# Patient Record
Sex: Male | Born: 1990 | Race: Black or African American | Hispanic: No | Marital: Single | Smoking: Never smoker
Health system: Southern US, Community
[De-identification: ages and names within clinical notes are randomized; demographics above are authoritative.]

## PROBLEM LIST (undated history)

## (undated) DIAGNOSIS — Z789 Other specified health status: Secondary | ICD-10-CM

## (undated) HISTORY — PX: NO PAST SURGERIES: SHX2092

## (undated) HISTORY — DX: Other specified health status: Z78.9

---
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
## 2014-10-03 ENCOUNTER — Ambulatory Visit (INDEPENDENT_AMBULATORY_CARE_PROVIDER_SITE_OTHER): Payer: BLUE CROSS/BLUE SHIELD

## 2014-10-03 ENCOUNTER — Ambulatory Visit (INDEPENDENT_AMBULATORY_CARE_PROVIDER_SITE_OTHER): Payer: BLUE CROSS/BLUE SHIELD | Admitting: Family Medicine

## 2014-10-03 VITALS — BP 145/80 | HR 89 | Temp 98.2°F | Resp 18 | Ht 73.0 in | Wt 384.0 lb

## 2014-10-03 DIAGNOSIS — R12 Heartburn: Secondary | ICD-10-CM

## 2014-10-03 DIAGNOSIS — K122 Cellulitis and abscess of mouth: Secondary | ICD-10-CM | POA: Diagnosis not present

## 2014-10-03 DIAGNOSIS — R400 Somnolence: Secondary | ICD-10-CM

## 2014-10-03 DIAGNOSIS — E669 Obesity, unspecified: Secondary | ICD-10-CM | POA: Diagnosis not present

## 2014-10-03 DIAGNOSIS — Z79899 Other long term (current) drug therapy: Secondary | ICD-10-CM | POA: Diagnosis not present

## 2014-10-03 DIAGNOSIS — G4710 Hypersomnia, unspecified: Secondary | ICD-10-CM

## 2014-10-03 DIAGNOSIS — R0683 Snoring: Secondary | ICD-10-CM | POA: Diagnosis not present

## 2014-10-03 LAB — POCT RAPID STREP A (OFFICE): Rapid Strep A Screen: NEGATIVE

## 2014-10-03 LAB — GLUCOSE, POCT (MANUAL RESULT ENTRY): POC GLUCOSE: 113 mg/dL — AB (ref 70–99)

## 2014-10-03 MED ORDER — PREDNISONE 20 MG PO TABS: 40.0000 mg | ORAL_TABLET | Freq: Every day | ORAL | Status: DC

## 2014-10-03 MED ORDER — AZITHROMYCIN 250 MG PO TABS: ORAL_TABLET | ORAL | Status: DC

## 2014-10-03 MED ORDER — OMEPRAZOLE 20 MG PO CPDR: 20.0000 mg | DELAYED_RELEASE_CAPSULE | Freq: Every day | ORAL | Status: DC

## 2014-10-03 NOTE — Patient Instructions (Addendum)
Your x-ray appears normal, your strep test was also normal. We can start azithromycin for right now to cover for other possible infections that will cause your uvula to swell, can also take Allegra over-the-counter once per day, and I also prescribed some prednisone in case this is allergic in nature.  I also sent in some omeprazole but can treat heartburn. Avoid spicy foods, and fluids are most important for the next few days until the swelling improves. If the swelling increases in size again to where you feel it is difficult to swallow or your choking, be seen in the emergency room to make sure there is no other treatment needed.  Return to the clinic or go to the nearest emergency room if any of your symptoms worsen or new symptoms occur.  I also referred you for the sleep study as we discussed, and follow back up with me to discuss weight and ways to address this further

## 2014-10-03 NOTE — Progress Notes (Addendum)
Subjective:    Patient ID: Bryan Garcia, male    DOB: May 27, 1990, 24 y.o.   MRN: [id number] This chart was scribed for Meredith Staggers, MD by Jolene Provost, Medical Scribe. This patient was seen in Room 8 and the patient's care was started a 3:50 PM.  Chief Complaint  Patient presents with  . Sore Throat    edema since sunday   . Shortness of Breath    HPI HPI Comments: Bryan Garcia is a 24 y.o. male who presents to [facility] complaining of sore and swollen throat and uvula for the last five days with associated difficulty breathing and choking. He states his sx are better today then they were two days ago. He endorses associated HA. At the onset of his sx he woke up suddenly having difficulty breathing due to mouth dryness and throat swelling. He denies fever, but states he hasn't checked his temperature. He denies sick contacts. He admits to snoring at night. He endorses daytime somnolence. He states he has a past hx of heartburn, but does not recall having any sx this week. He denies inhaling of steam or chemicals. Denies recent mariajuana use. Denies recent oral procedures.   He works as a Designer, television/film set for [employer].   There are no active problems to display for this patient.  History reviewed. No pertinent past medical history. History reviewed. No pertinent past surgical history. No Known Allergies Prior to Admission medications   Not on File   Social History   Social History  . Marital Status: Single    Spouse Name: N/A  . Number of Children: N/A  . Years of Education: N/A   Occupational History  . Not on file.   Social History Main Topics  . Smoking status: Never Smoker   . Smokeless tobacco: Not on file  . Alcohol Use: 0.6 oz/week    1 Standard drinks or equivalent per week  . Drug Use: No  . Sexual Activity: Not on file   Other Topics Concern  . Not on file   Social History Narrative  . No narrative on file    Review of Systems  Constitutional: Negative for  fever and chills.  HENT: Positive for sore throat. Negative for mouth sores and trouble swallowing.   Respiratory: Positive for choking and shortness of breath.   Psychiatric/Behavioral: Positive for sleep disturbance.       Objective:   Physical Exam  Constitutional: He is oriented to person, place, and time. He appears well-developed and well-nourished. No distress.  Clearing secretions normally, no distress.   HENT:  Head: Normocephalic and atraumatic.  Right Ear: Tympanic membrane, external ear and ear canal normal.  Left Ear: Tympanic membrane, external ear and ear canal normal.  Nose: No rhinorrhea.  Mouth/Throat: Oropharynx is clear and moist and mucous membranes are normal. No oropharyngeal exudate or posterior oropharyngeal erythema.  Slightly edematous, edematous, touches the back of the tongue but lifts with saying aaah. No vesicles in the mouth. No peritonsillar erythema or abscess.    Eyes: Conjunctivae are normal. Pupils are equal, round, and reactive to light.  Neck: Neck supple.  No stridor.  Cardiovascular: Normal rate, regular rhythm, normal heart sounds and intact distal pulses.   No murmur heard. Pulmonary/Chest: Effort normal and breath sounds normal. No respiratory distress. He has no wheezes. He has no rhonchi. He has no rales.  Abdominal: Soft. There is no tenderness.  Musculoskeletal: Normal range of motion.  Lymphadenopathy:    He has  no cervical adenopathy.  Neurological: He is alert and oriented to person, place, and time. Coordination normal.  Skin: Skin is warm and dry. No rash noted. He is not diaphoretic.  Small skin tag on the upper outer aspect of the uvula. No tonsillar exudates, or edema.  Psychiatric: He has a normal mood and affect. His behavior is normal.  Nursing note and vitals reviewed.  Filed Vitals:   10/03/14 1504  BP: 145/80  Pulse: 89  Temp: 98.2 F (36.8 C)  TempSrc: Oral  Resp: 18  Height:  (1.854 m)  Weight: 384 lb  (174.181 kg)  SpO2: 98%    Results for orders placed or performed in visit on 10/03/14  POCT rapid strep A  Result Value Ref Range   Rapid Strep A Screen Negative Negative  POCT glucose (manual entry)  Result Value Ref Range   POC Glucose 113 (A) 70 - 99 mg/dl    [facility] reading (PRIMARY) by  Dr. Neva Seat: Soft tissue neck. No apparent significant soft tissue swelling including epiglottis.      Assessment & Plan:   Bryan Garcia is a 24 y.o. male Uvulitis - Plan: POCT rapid strep A, DG Neck Soft Tissue, azithromycin (ZITHROMAX) 250 MG tablet, predniSONE (DELTASONE) 20 MG tablet  -Allergic versus less likely infectious source. Strep testing was negative, no exudates seen. Soft tissue of neck does not indicate any apparent epiglottic involvement. He is clearing secretions without difficulty and his swelling and symptoms have improved since yesterday. No apparent airway compromise  -Z-Pak to cover possible infectious source, Allegra over-the-counter once per day, and start prednisone 40 mg daily for 5 days.  -If symptoms persist, refer to ENT  -If any worsening of swelling or trouble clearing secretions, advised emergency department evaluation or 911.  Snoring, Daytime somnolence - Plan: Ambulatory referral to Sleep Studies  -Possible obstructive sleep apnea. Will refer for sleep studies to evaluate further.  Heartburn - Plan: omeprazole (PRILOSEC) 20 MG capsule  -Trigger avoidance discussed, omeprazole 20 mg daily. Discuss further if persistent symptoms, or chronic need of omeprazole.  High risk medication use - Plan: POCT glucose (manual entry) okay as nonfasting (for prednisone use)  Obesity  -Planning to discuss further at next visit. glucose drawn as above for prednisone use.  Meds ordered this encounter  Medications  . azithromycin (ZITHROMAX) 250 MG tablet    Sig: Take 2 pills by mouth on day 1, then 1 pill by mouth per day on days 2 through 5.    Dispense:  6 tablet     Refill:  0  . omeprazole (PRILOSEC) 20 MG capsule    Sig: Take 1 capsule (20 mg total) by mouth daily.    Dispense:  30 capsule    Refill:  1  . predniSONE (DELTASONE) 20 MG tablet    Sig: Take 2 tablets (40 mg total) by mouth daily with breakfast.    Dispense:  10 tablet    Refill:  0   Patient Instructions  Your x-ray appears normal, your strep test was also normal. We can start azithromycin for right now to cover for other possible infections that will cause your uvula to swell, can also take Allegra over-the-counter once per day, and I also prescribed some prednisone in case this is allergic in nature.  I also sent in some omeprazole but can treat heartburn. Avoid spicy foods, and fluids are most important for the next few days until the swelling improves. If the swelling increases  in size again to where you feel it is difficult to swallow or your choking, be seen in the emergency room to make sure there is no other treatment needed.  Return to the clinic or go to the nearest emergency room if any of your symptoms worsen or new symptoms occur.  I also referred you for the sleep study as we discussed, and follow back up with me to discuss weight and ways to address this further        By signing my name below, I, Javier Docker, attest that this documentation has been prepared under the direction and in the presence of Meredith Staggers, MD. Electronically Signed: Javier Docker, ER Scribe. 10/03/2014. 4:14 PM.   I personally performed the services described in this documentation, which was scribed in my presence. The recorded information has been reviewed and considered, and addended by me as needed.  \

## 2015-09-01 ENCOUNTER — Emergency Department (HOSPITAL_COMMUNITY)
Admission: EM | Admit: 2015-09-01 | Discharge: 2015-09-01 | Disposition: A | Discharge status: Home / Self Care | Payer: BLUE CROSS/BLUE SHIELD | Attending: Emergency Medicine | Admitting: Emergency Medicine

## 2015-09-01 ENCOUNTER — Encounter (HOSPITAL_COMMUNITY): Payer: Self-pay | Admitting: Emergency Medicine

## 2015-09-01 DIAGNOSIS — Y939 Activity, unspecified: Secondary | ICD-10-CM | POA: Diagnosis not present

## 2015-09-01 DIAGNOSIS — Y999 Unspecified external cause status: Secondary | ICD-10-CM | POA: Insufficient documentation

## 2015-09-01 DIAGNOSIS — R51 Headache: Secondary | ICD-10-CM | POA: Insufficient documentation

## 2015-09-01 DIAGNOSIS — Y92410 Unspecified street and highway as the place of occurrence of the external cause: Secondary | ICD-10-CM | POA: Insufficient documentation

## 2015-09-01 MED ORDER — NAPROXEN 500 MG PO TABS: 500.0000 mg | ORAL_TABLET | Freq: Two times a day (BID) | ORAL | 0 refills | Status: DC

## 2015-09-01 MED ORDER — METHOCARBAMOL 500 MG PO TABS: 500.0000 mg | ORAL_TABLET | Freq: Two times a day (BID) | ORAL | 0 refills | Status: DC
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
## 2015-09-01 NOTE — ED Notes (Signed)
Updated Dr. Wilkie AyeHorton on pt.'s right temporal headache with mild swelling , no order given .
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
## 2018-06-06 ENCOUNTER — Emergency Department (HOSPITAL_COMMUNITY): Payer: BLUE CROSS/BLUE SHIELD

## 2018-06-06 ENCOUNTER — Other Ambulatory Visit: Payer: Self-pay

## 2018-06-06 ENCOUNTER — Encounter (HOSPITAL_COMMUNITY): Payer: Self-pay | Admitting: Emergency Medicine

## 2018-06-06 ENCOUNTER — Emergency Department (HOSPITAL_COMMUNITY)
Admission: EM | Admit: 2018-06-06 | Discharge: 2018-06-06 | Disposition: A | Discharge status: Home / Self Care | Payer: BLUE CROSS/BLUE SHIELD | Attending: Emergency Medicine | Admitting: Emergency Medicine

## 2018-06-06 DIAGNOSIS — T148XXA Other injury of unspecified body region, initial encounter: Secondary | ICD-10-CM | POA: Diagnosis not present

## 2018-06-06 DIAGNOSIS — Y9389 Activity, other specified: Secondary | ICD-10-CM | POA: Insufficient documentation

## 2018-06-06 DIAGNOSIS — S20212A Contusion of left front wall of thorax, initial encounter: Secondary | ICD-10-CM | POA: Diagnosis not present

## 2018-06-06 DIAGNOSIS — Y92410 Unspecified street and highway as the place of occurrence of the external cause: Secondary | ICD-10-CM | POA: Insufficient documentation

## 2018-06-06 DIAGNOSIS — S0003XA Contusion of scalp, initial encounter: Secondary | ICD-10-CM | POA: Diagnosis not present

## 2018-06-06 DIAGNOSIS — S51812A Laceration without foreign body of left forearm, initial encounter: Secondary | ICD-10-CM | POA: Insufficient documentation

## 2018-06-06 DIAGNOSIS — Z23 Encounter for immunization: Secondary | ICD-10-CM | POA: Insufficient documentation

## 2018-06-06 DIAGNOSIS — R51 Headache: Secondary | ICD-10-CM | POA: Diagnosis not present

## 2018-06-06 DIAGNOSIS — S59912A Unspecified injury of left forearm, initial encounter: Secondary | ICD-10-CM | POA: Diagnosis present

## 2018-06-06 DIAGNOSIS — S301XXA Contusion of abdominal wall, initial encounter: Secondary | ICD-10-CM | POA: Insufficient documentation

## 2018-06-06 DIAGNOSIS — Y998 Other external cause status: Secondary | ICD-10-CM | POA: Diagnosis not present

## 2018-06-06 LAB — COMPREHENSIVE METABOLIC PANEL
ALT: 47 U/L — ABNORMAL HIGH (ref 0–44)
AST: 41 U/L (ref 15–41)
Albumin: 3.7 g/dL (ref 3.5–5.0)
Alkaline Phosphatase: 85 U/L (ref 38–126)
Anion gap: 8 (ref 5–15)
BUN: 13 mg/dL (ref 6–20)
CO2: 27 mmol/L (ref 22–32)
Calcium: 9.3 mg/dL (ref 8.9–10.3)
Chloride: 107 mmol/L (ref 98–111)
Creatinine, Ser: 1.2 mg/dL (ref 0.61–1.24)
GFR calc Af Amer: >60 mL/min (ref >60)
GFR calc non Af Amer: >60 mL/min (ref >60)
Glucose, Bld: 157 mg/dL — ABNORMAL HIGH (ref 70–99)
Potassium: 4.8 mmol/L (ref 3.5–5.1)
Sodium: 142 mmol/L (ref 135–145)
Total Bilirubin: 0.8 mg/dL (ref 0.3–1.2)
Total Protein: 6.7 g/dL (ref 6.5–8.1)

## 2018-06-06 LAB — I-STAT CHEM 8, ED
BUN: 14 mg/dL (ref 6–20)
Calcium, Ion: 1.17 mmol/L (ref 1.15–1.40)
Chloride: 109 mmol/L (ref 98–111)
Creatinine, Ser: 1.1 mg/dL (ref 0.61–1.24)
Glucose, Bld: 158 mg/dL — ABNORMAL HIGH (ref 70–99)
HCT: 42 % (ref 39.0–52.0)
Hemoglobin: 14.3 g/dL (ref 13.0–17.0)
Potassium: 4.1 mmol/L (ref 3.5–5.1)
Sodium: 144 mmol/L (ref 135–145)
TCO2: 25 mmol/L (ref 22–32)

## 2018-06-06 LAB — CBC
HCT: 41.5 % (ref 39.0–52.0)
Hemoglobin: 12.8 g/dL — ABNORMAL LOW (ref 13.0–17.0)
MCH: 26.8 pg (ref 26.0–34.0)
MCHC: 30.8 g/dL (ref 30.0–36.0)
MCV: 87 fL (ref 80.0–100.0)
Platelets: 302 10*3/uL (ref 150–400)
RBC: 4.77 MIL/uL (ref 4.22–5.81)
RDW: 14.3 % (ref 11.5–15.5)
WBC: 11.2 10*3/uL — ABNORMAL HIGH (ref 4.0–10.5)
nRBC: 0 % (ref 0.0–0.2)

## 2018-06-06 LAB — PROTIME-INR
INR: 1 (ref 0.8–1.2)
Prothrombin Time: 13.4 seconds (ref 11.4–15.2)

## 2018-06-06 LAB — SAMPLE TO BLOOD BANK

## 2018-06-06 MED ORDER — LIDOCAINE-EPINEPHRINE (PF) 2 %-1:200000 IJ SOLN
INTRAMUSCULAR | Status: AC
  Administered 2018-06-06: 06:00:00
  Filled 2018-06-06: qty 20

## 2018-06-06 MED ORDER — SODIUM CHLORIDE 0.9 % IV BOLUS
1000.0000 mL | Freq: Once | INTRAVENOUS | Status: AC
  Administered 2018-06-06: 1000 mL via INTRAVENOUS

## 2018-06-06 MED ORDER — TETANUS-DIPHTH-ACELL PERTUSSIS 5-2.5-18.5 LF-MCG/0.5 IM SUSP
0.5000 mL | Freq: Once | INTRAMUSCULAR | Status: AC
  Administered 2018-06-06: 0.5 mL via INTRAMUSCULAR
  Filled 2018-06-06: qty 0.5

## 2018-06-06 MED ORDER — METHOCARBAMOL 500 MG PO TABS: 500.0000 mg | ORAL_TABLET | Freq: Three times a day (TID) | ORAL | 0 refills | Status: DC | PRN

## 2018-06-06 MED ORDER — IOHEXOL 300 MG/ML  SOLN
100.0000 mL | Freq: Once | INTRAMUSCULAR | Status: AC | PRN
  Administered 2018-06-06: 100 mL via INTRAVENOUS

## 2018-06-06 MED ORDER — NAPROXEN 500 MG PO TABS: 500.0000 mg | ORAL_TABLET | Freq: Two times a day (BID) | ORAL | 0 refills | Status: AC

## 2018-06-06 MED ORDER — CEPHALEXIN 500 MG PO CAPS: 500.0000 mg | ORAL_CAPSULE | Freq: Three times a day (TID) | ORAL | 0 refills | Status: AC

## 2018-06-06 NOTE — ED Notes (Signed)
Patient provided with ginger ale - sitting up and tolerating well at this time. MD at bedside speaking with patient and patient's mother.

## 2018-06-06 NOTE — ED Notes (Signed)
ED Provider at bedside suturing patient's leg laceration.

## 2018-06-06 NOTE — ED Provider Notes (Signed)
  ..  Laceration Repair Date/Time: 06/06/2018 10:15 AM Performed by: Anselm Pancoast, PA-C Authorized by: Anselm Pancoast, PA-C   Consent:    Consent obtained:  Verbal   Consent given by:  Patient   Risks discussed:  Need for additional repair, infection, poor cosmetic result, poor wound healing, pain and retained foreign body Anesthesia (see MAR for exact dosages):    Anesthesia method:  Local infiltration   Local anesthetic:  Lidocaine 2% WITH epi Laceration details:    Location:  Leg   Leg location:  L lower leg   Length (cm):  16 Repair type:    Repair type:  Complex Pre-procedure details:    Preparation:  Patient was prepped and draped in usual sterile fashion Exploration:    Hemostasis achieved with:  Epinephrine   Wound exploration: entire depth of wound probed and visualized   Treatment:    Area cleansed with:  Betadine and saline   Amount of cleaning:  Extensive   Irrigation solution:  Sterile saline   Irrigation method:  Syringe   Debridement:  Minimal Subcutaneous repair:    Suture size:  4-0   Suture material:  Chromic gut   Suture technique:  Figure eight   Number of sutures:  4 Skin repair:    Repair method:  Sutures   Suture size:  3-0   Suture material:  Prolene   Suture technique:  Simple interrupted   Number of sutures:  17 Approximation:    Approximation:  Close Post-procedure details:    Dressing:  Non-adherent dressing and sterile dressing   Patient tolerance of procedure:  Tolerated well, no immediate complications     Concepcion Living 06/06/18 1059    Shaune Pollack, MD 06/06/18 2023

## 2018-06-06 NOTE — ED Notes (Signed)
Pt ambulated in hallway to the bathroom independently.

## 2018-06-06 NOTE — ED Provider Notes (Signed)
LACERATION REPAIR Performed by: Antony Madura Authorized by: Drema Pry, MD Consent: Verbal consent obtained. Risks and benefits: risks, benefits and alternatives were discussed Consent given by: patient Patient identity confirmed: provided demographic data Prepped and Draped in normal sterile fashion Wound explored  Laceration Location: L forearm  Laceration Length: 6cm  No Foreign Bodies seen or palpated  Anesthesia: local infiltration  Local anesthetic: lidocaine 2% with epinephrine  Anesthetic total: 6 ml  Irrigation method: syringe Amount of cleaning: standard  Skin closure: 4-0 ethilon  Number of sutures: 5  Technique: simple interrupted (1), horizontal mattress (4)  Patient tolerance: Patient tolerated the procedure well with no immediate complications.    Antony Madura, PA-C 06/06/18 9179    Nira Conn, MD 06/06/18 [phone]
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
## 2018-06-06 NOTE — ED Notes (Signed)
Patient verbalized understanding of discharge instructions and denies any further needs or questions at this time. VS stable. Patient ambulatory with steady gait. Escorted patient to vehicle at ED entrance in wheelchair.

## 2018-06-06 NOTE — Discharge Instructions (Addendum)
For your wounds, you can wash them with soap, do not rub with soap or rub to try.  Let them air dry after blotting.  Apply antibiotic ointment to the abrasions, as well as the lacerations.  Have prescribed antibiotic to help prevent infection.  Do not swim or submerge the limbs underwater, until sutures are removed in 10 days.  You can return to the ER or follow-up with your regular doctor for this.  If you develop worsening abdominal pain, shortness of breath, chest pain, return to the ER.  There is a small but real chance that initial traumatic injuries are missed, and it is important to seek medical evaluation.
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
## 2019-04-20 ENCOUNTER — Ambulatory Visit: Payer: BLUE CROSS/BLUE SHIELD | Attending: Internal Medicine

## 2019-04-20 DIAGNOSIS — Z23 Encounter for immunization: Secondary | ICD-10-CM

## 2019-04-20 NOTE — Progress Notes (Signed)
   Covid-19 Vaccination Clinic  Name:  Bryan Garcia    MRN: [id number] DOB: 1990-07-11  04/20/2019  [name] was observed post Covid-19 immunization for 15 minutes without incident. He was provided with Vaccine Information Sheet and instruction to access the V-Safe system.   [name] was instructed to call 911 with any severe reactions post vaccine: Marland Kitchen Difficulty breathing  . Swelling of face and throat  . A fast heartbeat  . A bad rash all over body  . Dizziness and weakness   Immunizations Administered    Name Date Dose VIS Date Route   Moderna COVID-19 Vaccine 04/20/2019 12:46 PM 0.5 mL 12/11/2018 Intramuscular   Manufacturer: Gala Murdoch   Lot: [id number]   NDC: 89842-103-12

## 2019-05-18 ENCOUNTER — Ambulatory Visit: Payer: BLUE CROSS/BLUE SHIELD | Attending: Internal Medicine

## 2019-05-18 DIAGNOSIS — Z23 Encounter for immunization: Secondary | ICD-10-CM

## 2019-05-18 NOTE — Progress Notes (Signed)
   Covid-19 Vaccination Clinic  Name:  Bryan Garcia    MRN: [id number] DOB: [birth year]  05/18/2019  Bryan Garcia was observed post Covid-19 immunization for 15 minutes without incident. He was provided with Vaccine Information Sheet and instruction to access the V-Safe system.   Bryan Garcia was instructed to call 911 with any severe reactions post vaccine: Marland Kitchen Difficulty breathing  . Swelling of face and throat  . A fast heartbeat  . A bad rash all over body  . Dizziness and weakness   Immunizations Administered    Name Date Dose VIS Date Route   Moderna COVID-19 Vaccine 05/18/2019 12:01 PM 0.5 mL 12/2018 Intramuscular   Manufacturer: Moderna   Lot: [id number]   NDC: 77373-668-15
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
## 2021-01-26 ENCOUNTER — Ambulatory Visit (INDEPENDENT_AMBULATORY_CARE_PROVIDER_SITE_OTHER): Payer: Managed Care, Other (non HMO) | Admitting: Family Medicine

## 2021-01-26 ENCOUNTER — Encounter: Payer: Self-pay | Admitting: Family Medicine

## 2021-01-26 VITALS — BP 111/62 | HR 88 | Temp 98.4°F | Ht 73.0 in | Wt >= 6400 oz

## 2021-01-26 DIAGNOSIS — Z0000 Encounter for general adult medical examination without abnormal findings: Secondary | ICD-10-CM

## 2021-01-26 DIAGNOSIS — Z114 Encounter for screening for human immunodeficiency virus [HIV]: Secondary | ICD-10-CM | POA: Diagnosis not present

## 2021-01-26 DIAGNOSIS — M79671 Pain in right foot: Secondary | ICD-10-CM | POA: Diagnosis not present

## 2021-01-26 DIAGNOSIS — M79672 Pain in left foot: Secondary | ICD-10-CM

## 2021-01-26 DIAGNOSIS — R0681 Apnea, not elsewhere classified: Secondary | ICD-10-CM | POA: Diagnosis not present

## 2021-01-26 DIAGNOSIS — Z1159 Encounter for screening for other viral diseases: Secondary | ICD-10-CM | POA: Diagnosis not present

## 2021-01-26 NOTE — Progress Notes (Signed)
Chief Complaint  Patient presents with   New Patient (Initial Visit)    Foot pain/both feet    Well Male Bryan Garcia is here for a complete physical.   His last physical was >1 year ago.  Current diet: in general, diet is fair.    Current exercise: pushups, sit-ups Weight trend: has gained some wt in the last few years Fatigue out of ordinary? Yes.  Seat belt? Yes.   Advanced directive? No  Health maintenance Tetanus- Yes HIV- No Hep C- No  Pain in feet Pt having pain in both feet over past couple years. No particular inj or change in activity. Stabbing pain in the bottom of both feet. Only happens if it happens for 10 hrs or longer. He works in a [employer].  No skin changes or decreased sensation.  Amotivation-over the past year, the patient has had more fatigue and decreased motivation to do things.  He gets around 4 hours of sleep nightly and has been told he snores and stops breathing while sleeping.  He has never had a sleep study done.  He has gained nearly 100 pounds in the past couple years.  Past Medical History:  Diagnosis Date   No known health problems      Past Surgical History:  Procedure Laterality Date   NO PAST SURGERIES      Medications  Takes no medications routinely.  Allergies No Known Allergies  Family History Family History  Problem Relation Age of Onset   Diabetes Mother    Heart disease Father    Dementia Paternal Grandmother    Heart disease Paternal Grandmother    Heart disease Paternal Grandfather    Cancer Neg Hx     Review of Systems: Constitutional: no fevers or chills Eye:  no recent significant change in vision Ear/Nose/Mouth/Throat:  Ears:  no hearing loss Nose/Mouth/Throat:  no complaints of nasal congestion, no sore throat Cardiovascular:  no chest pain Respiratory:  no shortness of breath Gastrointestinal:  no abdominal pain, no change in bowel habits GU:  Male: negative for dysuria Musculoskeletal/Extremities:  + Foot pain Integumentary (Skin/Breast):  no abnormal skin lesions reported Neurologic:  no headaches Endocrine: No unexpected weight loss Hematologic/Lymphatic:  no night sweats  Exam BP 111/62    Pulse 88    Temp 98.4 F (36.9 C) (Oral)    Ht 6\' 1"  (1.854 m)    Wt (!) 466 lb (211.4 kg)    SpO2 99%    BMI 61.48 kg/m  General:  well developed, well nourished, in no apparent distress Skin:  no significant moles, warts, or growths Head:  no masses, lesions, or tenderness Eyes:  pupils equal and round, sclera anicteric without injection Ears:  canals without lesions, TMs shiny without retraction, no obvious effusion, no erythema Nose:  nares patent, septum midline, mucosa normal Throat/Pharynx:  lips and gingiva without lesion; tongue and uvula midline; non-inflamed pharynx; no exudates or postnasal drainage Neck: neck supple without adenopathy, thyromegaly, or masses Lungs:  clear to auscultation, breath sounds equal bilaterally, no respiratory distress Cardio:  regular rate and rhythm, no bruits, no LE edema Abdomen:  abdomen soft, nontender; bowel sounds normal; no masses or organomegaly Genital (male): Deferred Rectal: Deferred Musculoskeletal: Arches of feet within normal limits, mild tenderness to palpation of the proximal arch but distal to the plantar fascia insertion bilaterally. Extremities:  no clubbing, cyanosis, or edema, no deformities, no skin discoloration Neuro:  gait normal; deep tendon reflexes normal and symmetric Psych:  well oriented with normal range of affect and appropriate judgment/insight  Assessment and Plan  Well adult exam - Plan: CBC, Comprehensive metabolic panel, Lipid panel  Witnessed apneic spells - Plan: Ambulatory referral to Pulmonology  Bilateral foot pain  Screening for HIV without presence of risk factors - Plan: HIV Antibody (routine testing w rflx)  Encounter for hepatitis C screening test for low risk patient - Plan: Hepatitis C antibody    Well 31 y.o. male. Counseled on diet and exercise. Politely declined flu shot.  Witnessed apneic spells: Likely causing his a motivation and contributing to his weight gain.  Refer to the pulmonology team for sleep evaluation. Foot pain: Likely related to weight and inadequate arch support.  Dr. Margart Sickles, power step, and potentially custom orthotics recommended. I did offer him referral to the bariatric surgery team or the nonsurgical medical weight loss team.  He politely declined but will let me know if he changes his mind. Bivalent COVID vaccination booster recommended.  Advanced directive form provided today.  Self testicular exams recommended at least monthly.  Other orders as above. Follow up in 1 year pending the above workup. The patient voiced understanding and agreement to the plan.  Jilda Roche Mulga, DO 01/26/21 1:30 PM

## 2021-01-26 NOTE — Patient Instructions (Addendum)
Give [place] 2-3 business days to get the results of your labs back.   Keep the diet clean and stay active.  Do monthly self testicular checks in the shower. You are feeling for lumps/bumps that don't belong. If you feel anything like this, let me know!  Bivalent COVID vaccination booster recommended.   Consider Powerstep insoles. There are very quality over the counter inserts. Shop around online and in stores. Dr. Margart Sickles is a cheaper alternative, though is not as high of quality.   If you do not hear anything about your referral in the next 1-2 weeks, call our office and ask for an update.   Let us know if you need anything.

## 2022-01-28 ENCOUNTER — Encounter: Payer: Managed Care, Other (non HMO) | Admitting: Family Medicine

## 2022-02-28 ENCOUNTER — Encounter: Payer: Self-pay | Admitting: Family Medicine

## 2022-02-28 ENCOUNTER — Ambulatory Visit (INDEPENDENT_AMBULATORY_CARE_PROVIDER_SITE_OTHER): Payer: Self-pay | Admitting: Family Medicine

## 2022-02-28 VITALS — BP 132/80 | HR 86 | Temp 98.7°F | Ht 73.0 in | Wt >= 6400 oz

## 2022-02-28 DIAGNOSIS — K068 Other specified disorders of gingiva and edentulous alveolar ridge: Secondary | ICD-10-CM

## 2022-02-28 DIAGNOSIS — Z0001 Encounter for general adult medical examination with abnormal findings: Secondary | ICD-10-CM

## 2022-02-28 DIAGNOSIS — Z1159 Encounter for screening for other viral diseases: Secondary | ICD-10-CM

## 2022-02-28 DIAGNOSIS — R0681 Apnea, not elsewhere classified: Secondary | ICD-10-CM

## 2022-02-28 DIAGNOSIS — Z0000 Encounter for general adult medical examination without abnormal findings: Secondary | ICD-10-CM

## 2022-02-28 DIAGNOSIS — Z114 Encounter for screening for human immunodeficiency virus [HIV]: Secondary | ICD-10-CM

## 2022-02-28 NOTE — Patient Instructions (Addendum)
Give [place] 2-3 business days to get the results of your labs back.   Keep the diet clean and stay active.  Please get me a copy of your advanced directive form at your convenience.   Do monthly self testicular checks in the shower. You are feeling for lumps/bumps that don't belong. If you feel anything like this, let me know!  If you do not hear anything about your referrals in the next 1-2 weeks, call our office and ask for an update.  Let us know if you need anything.  Ankle Exercises It is normal to feel mild stretching, pulling, tightness, or discomfort as you do these exercises, but you should stop right away if you feel sudden pain or your pain gets worse.  Stretching and range of motion exercises These exercises warm up your muscles and joints and improve the movement and flexibility of your ankle. These exercises also help to relieve pain, numbness, and tingling. Exercise A: Dorsiflexion/Plantar Flexion    Sit with your affected knee straight or bent. Do not rest your foot on anything. Flex your affected ankle to tilt the top of your foot toward your shin. Hold this position for 5 seconds. Point your toes downward to tilt the top of your foot away from your shin. Hold this position for 5 seconds. Repeat 2 times. Complete this exercise 3 times per week. Exercise B: Ankle Alphabet    Sit with your affected foot supported at your lower leg. Do not rest your foot on anything. Make sure your foot has room to move freely. Think of your affected foot as a paintbrush, and move your foot to trace each letter of the alphabet in the air. Keep your hip and knee still while you trace. Make the letters as large as you can without increasing any discomfort. Trace every letter from A to Z. Repeat 2 times. Complete this exercise 3 times per week. Strengthening exercises These exercises build strength and endurance in your ankle. Endurance is the ability to use your muscles for a long time,  even after they get tired. Exercise D: Dorsiflexors    Secure a rubber exercise band or tube to an object, such as a table leg, that will stay still when the band is pulled. Secure the other end around your affected foot. Sit on the floor, facing the object with your affected leg extended. The band or tube should be slightly tense when your foot is relaxed. Slowly flex your affected ankle and toes to bring your foot toward you. Hold this position for 3 seconds.  Slowly return your foot to the starting position, controlling the band as you do that. Do a total of 10 repetitions. Repeat 2 times. Complete this exercise 3 times per week. Exercise E: Plantar Flexors    Sit on the floor with your affected leg extended. Loop a rubber exercise band or tube around the ball of your affected foot. The ball of your foot is on the walking surface, right under your toes. The band or tube should be slightly tense when your foot is relaxed. Slowly point your toes downward, pushing them away from you. Hold this position for 3 seconds. Slowly release the tension in the band or tube, controlling smoothly until your foot is back in the starting position. Repeat for a total of 10 repetitions. Repeat 2 times. Complete this exercise 3 times per week. Exercise F: Towel Curls    Sit in a chair on a non-carpeted surface, and put your  feet on the floor. Place a towel in front of your feet.  Keeping your heel on the floor, put your affected foot on the towel. Pull the towel toward you by grabbing the towel with your toes and curling them under. Keep your heel on the floor. Let your toes relax. Grab the towel again. Keep going until the towel is completely underneath your foot. Repeat for a total of 10 repetitions. Repeat 2 times. Complete this exercise 3 times per week. Exercise G: Heel Raise ( Plantar Flexors, Standing)     Stand with your feet shoulder-width apart. Keep your weight spread evenly over the  width of your feet while you rise up on your toes. Use a wall or table to steady yourself, but try not to use it for support. If this exercise is too easy, try these options: Shift your weight toward your affected leg until you feel challenged. If told by your health care provider, lift your uninjured leg off the floor. Hold this position for 3 seconds. Repeat for a total of 10 repetitions. Repeat 2 times. Complete this exercise 3 times per week. Exercise H: Tandem Walking Stand with one foot directly in front of the other. Slowly raise your back foot up, lifting your heel before your toes, and place it directly in front of your other foot. Continue to walk in this heel-to-toe way for 10 steps or for as long as told by your health care provider. Have a countertop or wall nearby to use if needed to keep your balance, but try not to hold onto anything for support. Repeat 2 times. Complete this exercises 3 times per week. Make sure you discuss any questions you have with your health care provider. Document Released: 11/10/2004 Document Revised: 08/27/2015 Document Reviewed: 09/14/2014 Elsevier Interactive Patient Education  2018 [place] Many factors influence your heart health, including eating and exercise habits. Heart (coronary) risk increases with abnormal blood fat (lipid) levels. Heart-healthy meal planning includes limiting unhealthy fats, increasing healthy fats, and making other small dietary changes. This includes maintaining a healthy body weight to help keep lipid levels within a normal range.  WHAT IS MY PLAN?  Your health care provider recommends that you: Drink a glass of water before meals to help with satiety. Eat slowly. An alternative to the water is to add Metamucil. This will help with satiety as well. It does contain calories, unlike water.  WHAT TYPES OF FAT SHOULD I CHOOSE? Choose healthy fats more often. Choose monounsaturated and  polyunsaturated fats, such as olive oil and canola oil, flaxseeds, walnuts, almonds, and seeds. Eat more omega-3 fats. Good choices include salmon, mackerel, sardines, tuna, flaxseed oil, and ground flaxseeds. Aim to eat fish at least two times each week. Avoid foods with partially hydrogenated oils in them. These contain trans fats. Examples of foods that contain trans fats are stick margarine, some tub margarines, cookies, crackers, and other baked goods. If you are going to avoid a fat, this is the one to avoid!  WHAT GENERAL GUIDELINES DO I NEED TO FOLLOW? Check food labels carefully to identify foods with trans fats. Avoid these types of options when possible. Fill one half of your plate with vegetables and green salads. Eat 4-5 servings of vegetables per day. A serving of vegetables equals 1 cup of raw leafy vegetables,  cup of raw or cooked cut-up vegetables, or  cup of vegetable juice. Fill one fourth of your plate with whole grains. Look for  the word "whole" as the first word in the ingredient list. Fill one fourth of your plate with lean protein foods. Eat 4-5 servings of fruit per day. A serving of fruit equals one medium whole fruit,  cup of dried fruit,  cup of fresh, frozen, or canned fruit. Try to avoid fruits in cups/syrups as the sugar content can be high. Eat more foods that contain soluble fiber. Examples of foods that contain this type of fiber are apples, broccoli, carrots, beans, peas, and barley. Aim to get 20-30 g of fiber per day. Eat more home-cooked food and less restaurant, buffet, and fast food. Limit or avoid alcohol. Limit foods that are high in starch and sugar. Avoid fried foods when able. Cook foods by using methods other than frying. Baking, boiling, grilling, and broiling are all great options. Other fat-reducing suggestions include: Removing the skin from poultry. Removing all visible fats from meats. Skimming the fat off of stews, soups, and gravies before  serving them. Steaming vegetables in water or broth. Lose weight if you are overweight. Losing just 5-10% of your initial body weight can help your overall health and prevent diseases such as diabetes and heart disease. Increase your consumption of nuts, legumes, and seeds to 4-5 servings per week. One serving of dried beans or legumes equals  cup after being cooked, one serving of nuts equals 1 ounces, and one serving of seeds equals  ounce or 1 tablespoon.  WHAT ARE GOOD FOODS CAN I EAT? Grains Grainy breads (try to find bread that is 3 g of fiber per slice or greater), oatmeal, light popcorn. Whole-grain cereals. Rice and pasta, including brown rice and those that are made with whole wheat. Edamame pasta is a great alternative to grain pasta. It has a higher protein content. Try to avoid significant consumption of white bread, sugary cereals, or pastries/baked goods.  Vegetables All vegetables. Cooked white potatoes do not count as vegetables.  Fruits All fruits, but limit pineapple and bananas as these fruits have a higher sugar content.  Meats and Other Protein Sources Lean, well-trimmed beef, veal, pork, and lamb. Chicken and [place] without skin. All fish and shellfish. Wild duck, rabbit, pheasant, and venison. Egg whites or low-cholesterol egg substitutes. Dried beans, peas, lentils, and tofu. Seeds and most nuts.  Dairy Low-fat or nonfat cheeses, including ricotta, string, and mozzarella. Skim or 1% milk that is liquid, powdered, or evaporated. Buttermilk that is made with low-fat milk. Nonfat or low-fat yogurt. Soy/Almond milk are good alternatives if you cannot handle dairy.  Beverages Water is the best for you. Sports drinks with less sugar are more desirable unless you are a highly active athlete.  Sweets and Desserts Sherbets and fruit ices. Honey, jam, marmalade, jelly, and syrups. Dark chocolate.  Eat all sweets and desserts in moderation.  Fats and Oils Nonhydrogenated  (trans-free) margarines. Vegetable oils, including soybean, sesame, sunflower, olive, peanut, safflower, corn, canola, and cottonseed. Salad dressings or mayonnaise that are made with a vegetable oil. Limit added fats and oils that you use for cooking, baking, salads, and as spreads.  Other Cocoa powder. Coffee and tea. Most condiments.  The items listed above may not be a complete list of recommended foods or beverages. Contact your dietitian for more options.

## 2022-02-28 NOTE — Progress Notes (Signed)
Chief Complaint  Patient presents with   Annual Exam    Well Male Bryan Garcia is here for a complete physical.   His last physical was >1 year ago.  Current diet: in general, diet could be better.   Current exercise: cardio, lifting wts Weight trend: Gaining over the years Fatigue out of ordinary? Sometimes Seat belt? Yes.   Advanced directive? No  Health maintenance Tetanus- Yes HIV- No Hep C- No  Lesion on lower gum. No itching, drainage, change in size, pain. Has never seen a specialist for it. It is not changing colors. No hx of tobacco use.   Past Medical History:  Diagnosis Date   No known health problems      Past Surgical History:  Procedure Laterality Date   NO PAST SURGERIES      Medications  Takes no meds routinely.   Allergies No Known Allergies  Family History Family History  Problem Relation Age of Onset   Diabetes Mother    Heart disease Father    Dementia Paternal Grandmother    Heart disease Paternal Grandmother    Heart disease Paternal Grandfather    Cancer Neg Hx     Review of Systems: Constitutional: no fevers or chills Eye:  no recent significant change in vision Ear/Nose/Mouth/Throat:  Ears:  no hearing loss Nose/Mouth/Throat:  no complaints of nasal congestion, no sore throat Cardiovascular:  no chest pain Respiratory:  no shortness of breath Gastrointestinal:  no abdominal pain, no change in bowel habits GU:  Male: negative for dysuria Musculoskeletal/Extremities:  +L lateral ankle pain Integumentary (Skin/Breast):  no abnormal skin lesions reported Neurologic:  no headaches Endocrine: No unexpected weight loss Hematologic/Lymphatic:  no night sweats  Exam BP 132/80 (BP Location: Left Arm, Patient Position: Sitting, Cuff Size: Large)   Pulse 86   Temp 98.7 F (37.1 C) (Oral)   Ht 6' 1"$  (1.854 m)   Wt (!) 473 lb 8 oz (214.8 kg)   SpO2 94%   BMI 62.47 kg/m  General:  well developed, well nourished, in no apparent  distress Skin:  no significant moles, warts, or growths Head:  no masses, lesions, or tenderness Eyes:  pupils equal and round, sclera anicteric without injection Ears:  canals without lesions, TMs shiny without retraction, no obvious effusion, no erythema Nose:  nares patent, mucosa normal Throat/Pharynx:  R lower gum line in front of R lateral incisor, there is a growth aiming up in line with tooth; otherwise lips and gingiva without lesion; tongue and uvula midline; non-inflamed pharynx; no exudates or postnasal drainage Neck: neck supple without adenopathy, thyromegaly, or masses Lungs:  clear to auscultation, breath sounds equal bilaterally, no respiratory distress Cardio:  regular rate and rhythm, no bruits, no LE edema Abdomen:  abdomen soft, nontender; bowel sounds normal; no masses or organomegaly Genital (male): Deferred Rectal: Deferred Musculoskeletal:  symmetrical muscle groups noted without atrophy or deformity Extremities:  no clubbing, cyanosis, or edema, no deformities, no skin discoloration Neuro:  gait normal; deep tendon reflexes normal and symmetric Psych: well oriented with normal range of affect and appropriate judgment/insight  Assessment and Plan  Well adult exam - Plan: CBC, Comprehensive metabolic panel, Hepatitis C antibody, Lipid panel  Screening for HIV without presence of risk factors - Plan: HIV Antibody (routine testing w rflx)  Encounter for hepatitis C screening test for low risk patient - Plan: Hepatitis C antibody  Witnessed apneic spells - Plan: Ambulatory referral to Neurology  Gum lesion - Plan:  Ambulatory referral to ENT   Well 32 y.o. male. Counseled on diet and exercise. Self testicular exams recommended at least monthly.  Advanced directive form provided today.  Other orders as above. Screen HIV/Hep C today. Probably has OSA. Will refer to neuro for eval.  Skin lesion: Exostosis? Will refer to ENT for further eval. Nothing sinister in  hx today.  Follow up in 1 year pending the above workup. The patient voiced understanding and agreement to the plan.  [place], DO 02/28/22 7:45 AM

## 2023-03-03 ENCOUNTER — Encounter: Payer: BC Managed Care – PPO | Admitting: Family Medicine
